# Patient Record
Sex: Female | Born: 1950 | Hispanic: Yes | Marital: Single
Health system: Northeastern US, Community
[De-identification: ages and names within clinical notes are randomized; demographics above are authoritative.]

---

## 1999-11-17 ENCOUNTER — Ambulatory Visit (HOSPITAL_BASED_OUTPATIENT_CLINIC_OR_DEPARTMENT_OTHER): Payer: Self-pay

## 1999-11-17 LAB — CHG CYTP SLIDES CERV/VAG MNL SCRN PHYSICIAN SUPV

## 1999-11-17 LAB — BLOOD COUNT COMPLETE AUTOMATED
HEMATOCRIT: 37.9 % (ref 37.0–47.0)
HEMOGLOBIN: 13 g/dL (ref 12.0–16.0)
MEAN CORP HGB CONC: 34.2 g/dL (ref 32.0–36.0)
MEAN CORPUSCULAR HGB: 31 pg (ref 27.0–31.0)
MEAN CORPUSCULAR VOL: 90.7 fL (ref 81.0–99.0)
MEAN PLATELET VOLUME: 8.2 fL (ref 8.0–12.0)
PLATELET COUNT: 255 10*3/uL (ref 150–400)
RBC DISTRIBUTION WIDTH: 12.9 % (ref 11.5–14.3)
RED BLOOD CELL COUNT: 4.17 MIL/uL — ABNORMAL LOW (ref 4.20–5.40)
WHITE BLOOD CELL COUNT: 5.2 10*3/uL (ref 4.8–10.8)

## 1999-11-17 LAB — AUTOMATED WBC DIFF
BASOPHIL %: 0.8 % (ref 0–2)
EOSINOPHIL %: 2 % (ref 0–7)
LYMPHOCYTE %: 22.5 % (ref 12–39)
MONOCYTE %: 6.3 % (ref 1–12)
NEUTROPHIL %: 68.4 % (ref 46–79)

## 1999-11-17 LAB — COMPREHENSIVE METABOLIC PANEL
ALANINE AMINOTRANSFERASE: 11 IU/L (ref 3–36)
ALBUMIN: 4.3 g/dl (ref 3.5–5.0)
ALKALINE PHOSPHATASE: 53 IU/L (ref 50–136)
ASPARTATE AMINOTRANSFERASE: 23 U/L (ref 7–29)
BILIRUBIN TOTAL: 0.9 mg/dl (ref 0.15–1.0)
BUN (UREA NITROGEN): 15 mg/dl (ref 10–20)
CALCIUM: 9.4 mg/dl (ref 8.5–10.5)
CARBON DIOXIDE: 30 mEQ/L (ref 22–32)
CHLORIDE: 104 mEQ/L (ref 98–110)
CREATININE: 0.8 mg/dl (ref 0.8–1.2)
Glucose Random: 97 mg/dl (ref 65–160)
POTASSIUM: 3.8 mEQ/L (ref 3.5–5.0)
SODIUM: 139 mEQ/L (ref 135–145)
TOTAL PROTEIN: 6.8 g/dl (ref 6.2–8.5)

## 1999-11-17 LAB — IADNA CHLAMYDIA TRACHOMATIS DIRECT PROBE TQ: GENPROBE CHLAMYDIA: NEGATIVE

## 1999-11-17 LAB — IADNA NEISSERIA GONORRHOEAE DIRECT PROBE TQ: GENPROBE GC: NEGATIVE

## 1999-11-17 LAB — CHOLESTEROL SERUM/WHOLE BLOOD TOTAL: Cholesterol: 158 mg/dl (ref ?–200)

## 1999-11-17 LAB — CHG LIPOPROTEIN DIR MEAS HIGH DENSITY CHOLESTEROL: HIGH DENSITY LIPOPROTEIN: 71 mg/dl (ref 35–95)

## 1999-11-17 LAB — RPR: RPR: NONREACTIVE

## 1999-11-17 LAB — CHG LIPOPROTEIN DIRECT MEASUREMENT LDL CHOLESTEROL: LOW DENSITY LIPOPROTEIN DIRECT: 64 mg/dl (ref ?–130)

## 1999-11-17 LAB — THYROID SCREEN TSH REFLEX FT4: THYROID SCREEN TSH REFLEX FT4: 0.62 u[IU]/mL (ref 0.34–5.60)

## 2000-02-09 ENCOUNTER — Ambulatory Visit (HOSPITAL_BASED_OUTPATIENT_CLINIC_OR_DEPARTMENT_OTHER): Payer: Self-pay

## 2000-02-09 LAB — URINALYSIS
BACTERIA: NONE SEEN PER HPF
BILIRUBIN, URINE: NEGATIVE
CASTS: NONE SEEN PER LPF
CRYSTALS: NONE SEEN
GLUCOSE, URINE: NEGATIVE MG/DL
KETONE, URINE: NEGATIVE MG/DL
LEUKOCYTE ESTERASE: NEGATIVE
NITRITE, URINE: NEGATIVE
PH URINE: 6 (ref 5.0–8.0)
PROTEIN, URINE: NEGATIVE MG/DL
RENAL EPITHELIAL CELLS: NONE SEEN PER LPF
SPECIFIC GRAVITY URINE: 1.025 (ref 1.003–1.030)
TRANSITIONAL EPITHELIALS: NONE SEEN PER LPF

## 2000-02-09 LAB — URINE CULTURE/COLONY COUNT: URINE CULTURE/COLONY COUNT: NO GROWTH

## 2000-02-16 ENCOUNTER — Other Ambulatory Visit (HOSPITAL_BASED_OUTPATIENT_CLINIC_OR_DEPARTMENT_OTHER): Payer: Self-pay

## 2000-02-18 ENCOUNTER — Other Ambulatory Visit (HOSPITAL_BASED_OUTPATIENT_CLINIC_OR_DEPARTMENT_OTHER): Payer: Self-pay

## 2000-02-18 LAB — OVA AND PARASITES: OVA AND PARASITES: NONE SEEN

## 2000-02-19 LAB — OVA AND PARASITES
OVA AND PARASITES: NONE SEEN
OVA AND PARASITES: NONE SEEN

## 2000-03-01 ENCOUNTER — Ambulatory Visit (HOSPITAL_BASED_OUTPATIENT_CLINIC_OR_DEPARTMENT_OTHER): Payer: Self-pay

## 2000-03-01 LAB — BLOOD COUNT COMPLETE AUTOMATED
HEMATOCRIT: 36.1 % — ABNORMAL LOW (ref 37.0–47.0)
HEMOGLOBIN: 12.3 g/dL (ref 12.0–16.0)
MEAN CORP HGB CONC: 34.2 g/dL (ref 32.0–36.0)
MEAN CORPUSCULAR HGB: 31.3 pg — ABNORMAL HIGH (ref 27.0–31.0)
MEAN CORPUSCULAR VOL: 91.6 fL (ref 81.0–99.0)
MEAN PLATELET VOLUME: 8.6 fL (ref 8.0–12.0)
PLATELET COUNT: 237 10*3/uL (ref 150–400)
RBC DISTRIBUTION WIDTH: 12.1 % (ref 11.5–14.3)
RED BLOOD CELL COUNT: 3.94 MIL/uL — ABNORMAL LOW (ref 4.20–5.40)
WHITE BLOOD CELL COUNT: 4.8 10*3/uL (ref 4.8–10.8)

## 2000-03-01 LAB — TSH (THYROID STIMULATING HORMONE): TSH (THYROID STIM HORMONE): 0.35 u[IU]/mL (ref 0.34–5.60)

## 2000-03-02 ENCOUNTER — Other Ambulatory Visit (HOSPITAL_BASED_OUTPATIENT_CLINIC_OR_DEPARTMENT_OTHER): Payer: Self-pay

## 2001-03-30 ENCOUNTER — Emergency Department (HOSPITAL_BASED_OUTPATIENT_CLINIC_OR_DEPARTMENT_OTHER): Payer: Self-pay | Admitting: Emergency Medicine

## 2001-04-02 LAB — C-SPINE W OBLIQUES  5 VIEWS

## 2002-08-08 ENCOUNTER — Encounter (HOSPITAL_BASED_OUTPATIENT_CLINIC_OR_DEPARTMENT_OTHER): Payer: Self-pay | Admitting: Internal Medicine

## 2003-08-30 ENCOUNTER — Encounter (HOSPITAL_BASED_OUTPATIENT_CLINIC_OR_DEPARTMENT_OTHER): Payer: Self-pay | Admitting: Internal Medicine

## 2006-12-20 ENCOUNTER — Emergency Department (HOSPITAL_BASED_OUTPATIENT_CLINIC_OR_DEPARTMENT_OTHER): Payer: Self-pay

## 2006-12-20 LAB — URINALYSIS
BILIRUBIN, URINE: NEGATIVE
CASTS: NONE SEEN PER LPF
CRYSTALS: NONE SEEN
GLUCOSE, URINE: NEGATIVE MG/DL
KETONE, URINE: NEGATIVE MG/DL
NITRITE, URINE: NEGATIVE
PH URINE: 7 (ref 5.0–8.0)
PROTEIN, URINE: NEGATIVE MG/DL
SPECIFIC GRAVITY URINE: 1.005 (ref 1.003–1.035)

## 2006-12-21 LAB — URINE CULTURE/COLONY COUNT: URINE CULTURE/COLONY COUNT: NO GROWTH

## 2006-12-24 LAB — EMERGENCY ROOM NOTE

## 2010-11-23 ENCOUNTER — Encounter (HOSPITAL_BASED_OUTPATIENT_CLINIC_OR_DEPARTMENT_OTHER): Payer: Self-pay

## 2010-11-23 ENCOUNTER — Emergency Department (HOSPITAL_BASED_OUTPATIENT_CLINIC_OR_DEPARTMENT_OTHER)
Admission: RE | Admit: 2010-11-23 | Disposition: A | Discharge status: Home / Self Care | Payer: Self-pay | Source: Emergency Department | Attending: Emergency Medicine | Admitting: Emergency Medicine

## 2010-11-23 LAB — PROTHROMBIN TIME
INR: 1 — ABNORMAL LOW (ref 2.0–3.5)
PROTHROMBIN TIME: 11.1 SECONDS (ref 9.5–11.5)

## 2010-11-23 LAB — COMPREHENSIVE METABOLIC PANEL
ALANINE AMINOTRANSFERASE: 22 IU/L (ref 7–35)
ALBUMIN: 4.3 g/dl (ref 3.4–4.8)
ALKALINE PHOSPHATASE: 78 IU/L (ref 25–106)
ANION GAP: 9 mmol/L (ref 2–25)
ASPARTATE AMINOTRANSFERASE: 27 IU/L (ref 8–34)
BILIRUBIN TOTAL: 1.3 mg/dl — ABNORMAL HIGH (ref 0.2–1.1)
BUN (UREA NITROGEN): 10 mg/dl (ref 6–20)
CALCIUM: 9.5 mg/dl (ref 8.6–10.3)
CARBON DIOXIDE: 27 mmol/L (ref 22–32)
CHLORIDE: 106 mmol/L (ref 101–111)
CREATININE: 0.6 mg/dl (ref 0.4–1.2)
ESTIMATED GLOMERULAR FILT RATE: >60 mL/min (ref >60)
Glucose Random: 85 mg/dl (ref 74–160)
POTASSIUM: 4.1 mmol/L (ref 3.5–5.1)
SODIUM: 142 mmol/L (ref 135–144)
TOTAL PROTEIN: 6.7 g/dl (ref 5.9–7.5)

## 2010-11-23 LAB — URINALYSIS
BACTERIA: NONE SEEN PER HPF (ref 0–5)
BILIRUBIN, URINE: NEGATIVE
CASTS: NONE SEEN PER LPF
CRYSTALS: NONE SEEN
GLUCOSE, URINE: NEGATIVE MG/DL
KETONE, URINE: NEGATIVE MG/DL
LEUKOCYTE ESTERASE: NEGATIVE
NITRITE, URINE: NEGATIVE
PH URINE: 6.5 (ref 5.0–8.0)
PROTEIN, URINE: NEGATIVE MG/DL
SPECIFIC GRAVITY URINE: 1.01 (ref 1.003–1.035)
WHITE BLOOD CELLS URINE: NONE SEEN PER HPF (ref 0–4)

## 2010-11-23 LAB — CBC, PLATELET & DIFFERENTIAL
BASOPHIL %: 0.8 % (ref 0.0–2.0)
EOSINOPHIL %: 2.2 % (ref 0.0–7.0)
HEMATOCRIT: 38.2 % (ref 36.0–48.0)
HEMOGLOBIN: 13.2 g/dl (ref 12.0–16.0)
LYMPHOCYTE %: 21.8 % (ref 13.0–39.0)
MEAN CORP HGB CONC: 34.5 g/dl (ref 32.0–36.0)
MEAN CORPUSCULAR HGB: 31.9 pg (ref 27.0–33.0)
MEAN CORPUSCULAR VOL: 92.4 fl (ref 80.0–100.0)
MEAN PLATELET VOLUME: 8.8 fl (ref 6.4–10.8)
MONOCYTE %: 5.3 % (ref 1.0–12.0)
NEUTROPHIL %: 69.9 % (ref 46.0–79.0)
PLATELET COUNT: 195 10*3/uL (ref 150–400)
RBC DISTRIBUTION WIDTH: 12.8 % (ref 11.5–14.3)
RED BLOOD CELL COUNT: 4.13 M/uL — ABNORMAL LOW (ref 4.50–5.10)
WHITE BLOOD CELL COUNT: 5.8 10*3/uL (ref 4.0–10.8)

## 2010-11-23 LAB — APTT: APTT: 29 SECONDS (ref 23.7–33.3)

## 2010-11-23 LAB — LIPASE: LIPASE: 21 U/L (ref 10–50)

## 2010-11-23 LAB — CT ABDOMEN & PELVIS W IV CONTRAST

## 2010-11-23 MED ORDER — CIPROFLOXACIN HCL 250 MG PO TABS
500.0000 mg | ORAL_TABLET | Freq: Once | ORAL | Status: AC
  Administered 2010-11-23: 500 mg via ORAL
  Filled 2010-11-23: qty 2

## 2010-11-23 MED ORDER — MORPHINE SULFATE 2 MG/ML IJ SOLN
2.00 mg | Freq: Once | INTRAMUSCULAR | Status: AC
Start: 2010-11-23 — End: 2010-11-23
  Administered 2010-11-23: 2 mg via INTRAVENOUS
  Filled 2010-11-23: qty 1

## 2010-11-23 MED ORDER — OXYCODONE-ACETAMINOPHEN 5-325 MG PO TABS
1.00 | ORAL_TABLET | ORAL | Status: AC | PRN
Start: 2010-11-23 — End: 2010-11-30

## 2010-11-23 MED ORDER — CIPROFLOXACIN HCL 500 MG PO TABS
500.00 mg | ORAL_TABLET | Freq: Two times a day (BID) | ORAL | Status: AC
Start: 2010-11-23 — End: 2010-12-03

## 2010-11-23 MED ORDER — IOPAMIDOL 76 % IV SOLN
100.00 mL | Freq: Once | INTRAVENOUS | Status: AC
Start: 2010-11-23 — End: 2010-11-23
  Administered 2010-11-23: 100 mL via INTRAVENOUS

## 2010-11-23 MED ORDER — STERILE WATER PO LIQD
520.00 mL | ORAL | Status: AC | PRN
Start: 2010-11-23 — End: 2010-11-23
  Administered 2010-11-23 (×2): 520 mL via ORAL

## 2010-11-23 MED ORDER — SODIUM CHLORIDE 0.9 % IV BOLUS
1000.00 mL | Freq: Once | INTRAVENOUS | Status: AC
Start: 2010-11-23 — End: 2010-11-23
  Administered 2010-11-23: 1000 mL via INTRAVENOUS

## 2010-11-23 MED ORDER — METRONIDAZOLE 500 MG PO TABS
500.00 mg | ORAL_TABLET | Freq: Once | ORAL | Status: AC
Start: 2010-11-23 — End: 2010-11-23
  Administered 2010-11-23: 500 mg via ORAL
  Filled 2010-11-23: qty 1

## 2010-11-23 MED ORDER — METRONIDAZOLE 500 MG PO TABS
500.00 mg | ORAL_TABLET | Freq: Three times a day (TID) | ORAL | Status: AC
Start: 2010-11-23 — End: 2010-11-28

## 2010-11-23 NOTE — Discharge Instructions (Signed)
At home, rest, drink plenty of fluids.

## 2010-11-24 LAB — URINE CULTURE/COLONY COUNT: URINE CULTURE/COLONY COUNT: NO GROWTH

## 2010-12-01 LAB — EMERGENCY ROOM NOTE

## 2018-08-23 ENCOUNTER — Encounter (HOSPITAL_BASED_OUTPATIENT_CLINIC_OR_DEPARTMENT_OTHER): Payer: Self-pay | Admitting: Ophthalmology

## 2018-08-23 ENCOUNTER — Ambulatory Visit: Payer: Medicare (Managed Care) | Attending: Ophthalmology | Admitting: Ophthalmology

## 2018-08-23 DIAGNOSIS — H268 Other specified cataract: Secondary | ICD-10-CM | POA: Diagnosis present

## 2018-08-23 DIAGNOSIS — H43392 Other vitreous opacities, left eye: Principal | ICD-10-CM | POA: Insufficient documentation

## 2018-08-23 DIAGNOSIS — Z83511 Family history of glaucoma: Secondary | ICD-10-CM | POA: Insufficient documentation

## 2018-08-23 DIAGNOSIS — H5200 Hypermetropia, unspecified eye: Secondary | ICD-10-CM | POA: Insufficient documentation

## 2018-08-23 DIAGNOSIS — H04129 Dry eye syndrome of unspecified lacrimal gland: Secondary | ICD-10-CM | POA: Insufficient documentation

## 2018-08-23 DIAGNOSIS — H524 Presbyopia: Secondary | ICD-10-CM | POA: Diagnosis not present

## 2018-08-23 MED ORDER — CARBOXYMETHYLCELLULOSE SODIUM 0.5 % OP SOLN: 1 [drp] | mL | Freq: Four times a day (QID) | OPHTHALMIC | 11 refills | 0 days | Status: AC | PRN

## 2018-08-23 MED ORDER — CARBOXYMETHYLCELLULOSE SODIUM 0.5 % OP SOLN
1.0000 [drp] | Freq: Four times a day (QID) | OPHTHALMIC | 11 refills | Status: AC | PRN
Start: 2018-08-23 — End: 2019-08-24

## 2018-08-23 NOTE — Progress Notes (Signed)
New patient here for Eye Examination.   Vision is ok OU with glasses.   No pain.   Seeing floaters(mosquito like)  over the vision OS for about year.   No flashes.   Denies ocular/head trauma.   Older sister had surgery done for Glaucoma.    Ocular meds   Systane 1/day OU

## 2018-08-27 NOTE — Progress Notes (Signed)
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

## 2024-05-04 IMAGING — MR punho Direito
12 series · 40 of 40 positions shown · non-contrast
Comparison: none

[Series 4: coronal_dp_fs · coronal · right · 2.5mm · 0.55mm/px · 1 of 19 slices shown]
[im 1/19]
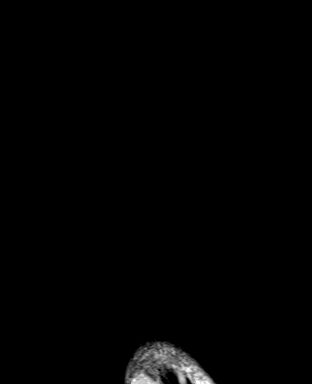

[Series 5: coronal_t1 · coronal · right · 2.5mm · 0.55mm/px · 2 of 19 slices shown]
[im 1/19]
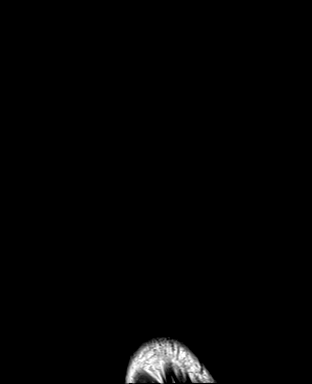
[im 19/19]
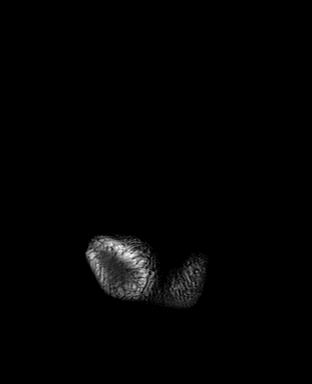

[Series 6: axial_dp_fs_mao · axial · right · 4.0mm · 0.29mm/px · z∈[-80,+105]mm · 4 of 38 slices shown]
[im 1/38]
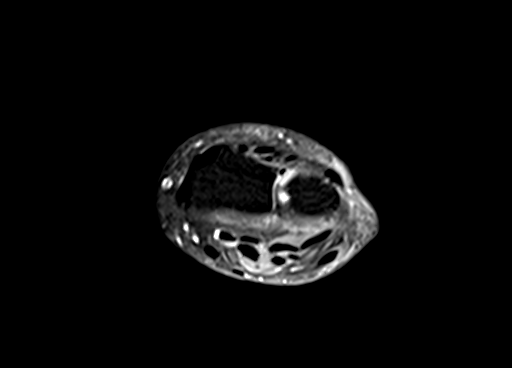
[im 13/38]
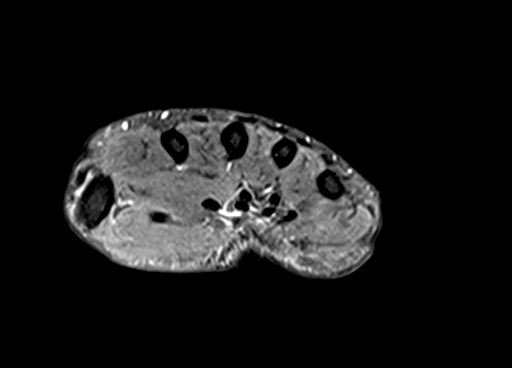
[im 25/38]
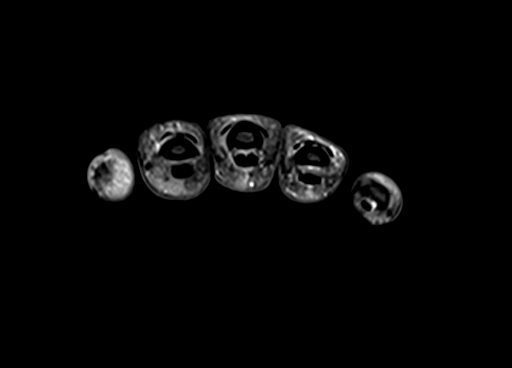
[im 38/38]
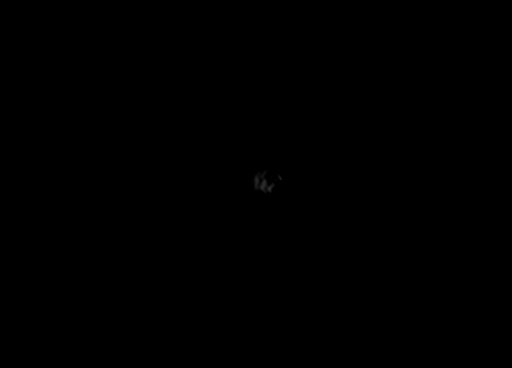

[Series 7: axial_t1_mao · axial · right · 4.0mm · 0.47mm/px · z∈[-80,+105]mm · 4 of 38 slices shown]
[im 1/38]
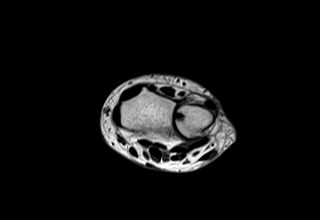
[im 13/38]
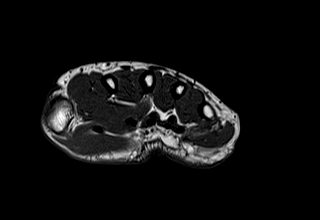
[im 25/38]
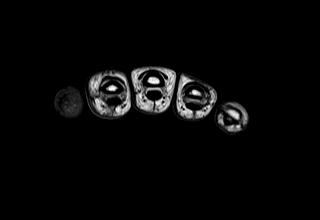
[im 38/38]
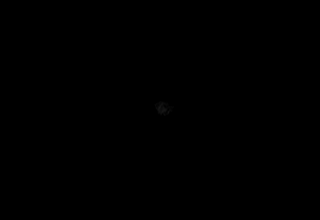

[Series 8: sagital_dp_fs · sagittal · right · 4.0mm · 0.66mm/px · 2 of 20 slices shown]
[im 1/20]
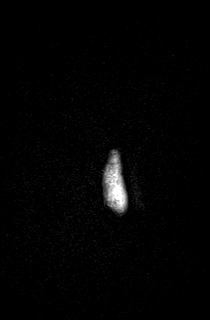
[im 20/20]
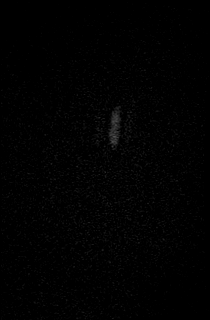

[Series 9: cor dp fat · coronal · right · 3.0mm · 0.38mm/px · 1 of 14 slices shown]
[im 1/14]
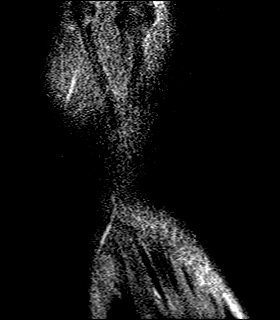

[Series 13: cor t1_vibe_dixon_iso_opp · coronal · right · 1.1mm · 1.15mm/px · 5 of 56 slices shown]
[im 1/56]
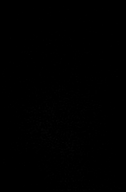
[im 14/56]
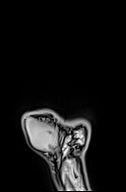
[im 28/56]
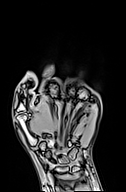
[im 42/56]
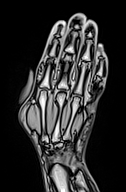
[im 56/56]
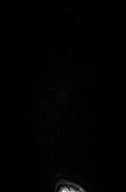

[Series 14: cor t1_vibe_dixon_iso_in · coronal · right · 1.1mm · 1.15mm/px · 5 of 55 slices shown]
[im 1/55]
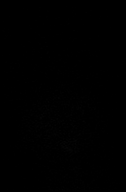
[im 14/55]
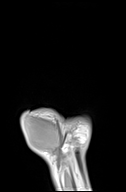
[im 28/55]
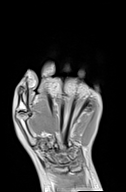
[im 41/55]
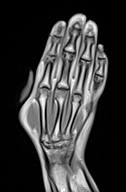
[im 55/55]
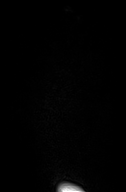

[Series 15: cor t1_vibe_dixon_iso_f · coronal · right · 1.1mm · 1.15mm/px · 5 of 56 slices shown]
[im 1/56]
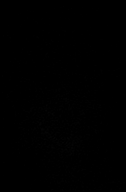
[im 14/56]
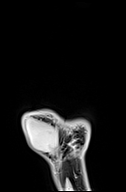
[im 28/56]
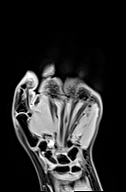
[im 42/56]
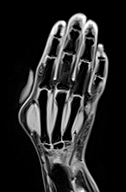
[im 56/56]
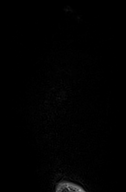

[Series 16: cor t1_vibe_dixon_iso_w · coronal · right · 1.1mm · 1.15mm/px · 5 of 53 slices shown]
[im 1/53]
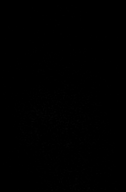
[im 14/53]
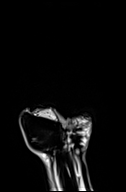
[im 27/53]
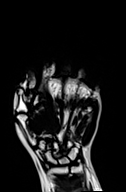
[im 40/53]
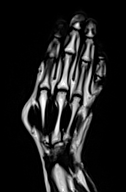
[im 53/53]
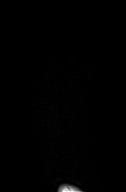

[Series 17: coronal_t1_fs · coronal · right · 2.5mm · 0.66mm/px · 2 of 16 slices shown]
[im 1/16]
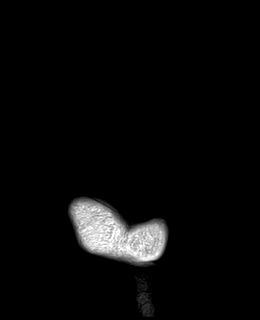
[im 16/16]
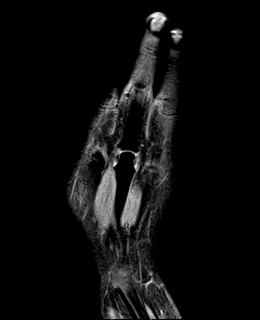

[Series 18: axial_t1_fs_mao · axial · right · 4.0mm · 0.29mm/px · z∈[-32,+153]mm · 4 of 38 slices shown]
[im 1/38]
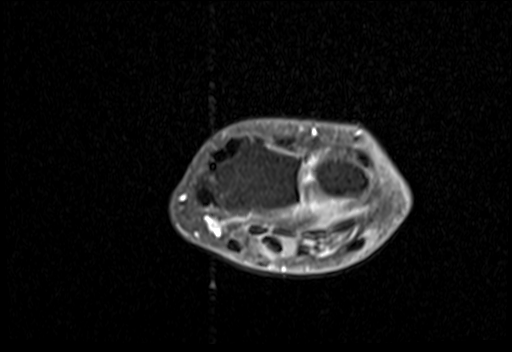
[im 13/38]
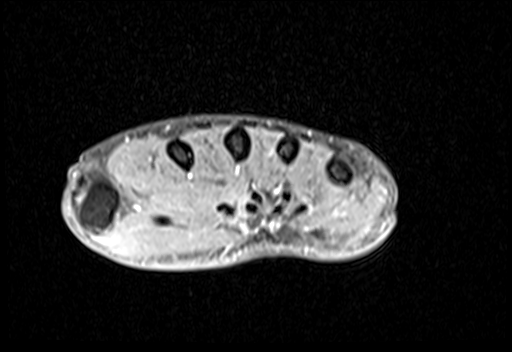
[im 25/38]
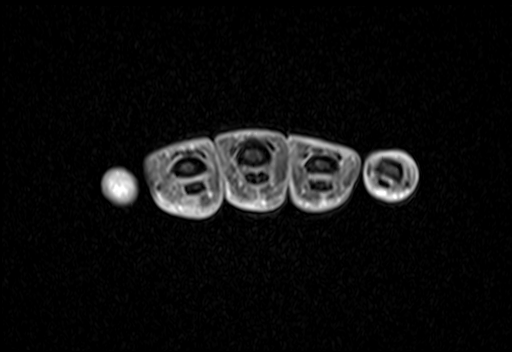
[im 38/38]
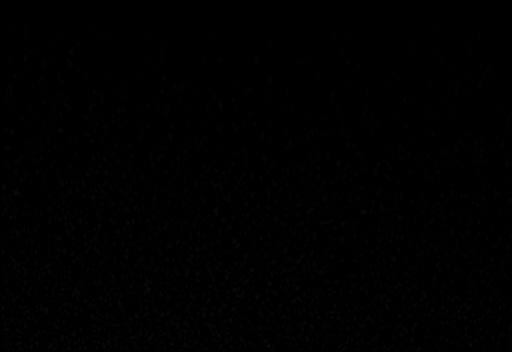

[40 of 40 positions shown; findings below may reference images not displayed]

RESSONÂNCIA MAGNÉTICA DO PUNHO DIREITO

TÉCNICA:
Exame realizado em equipamento de ressonância magnética com sequências, ponderações e planos específicos para o segmento de interesse, antes e após a administração endovenosa do meio de contraste.

RESULTADO:
Sinais de rizartrose caracterizada por afilamento condral da base do primeiro metacarpo e trapézio distal com edema ósseo subcortical e osteófitos marginais.
Alteração fibrocística no capitato.
Discreta sinovite radioulnar distal, radiocárpica, intercárpica e carpometacarpal notando-se a presença de pequenas erosões ósseas no rádio e ulna distais e nos ossos do carpo. A possibilidade de artropatia inflamatória, entre elas de artrite reumatoide, deve ser considerada neste contexto.
Demais estruturas ósseas com morfologia e sinal medular conservados.
Ligamentos escafosemilunar e lunopiramidal íntegros.
Complexo da fibrocartilagem triangular de forma e sinal conservados, sem lesões.
Tendões flexores preservados no interior do túnel do carpo.
Tendões dos seis compartimentos extensores íntegros.
Retináculo dos flexores de topografia habitual, contornos regulares e espessura conservada.
Nervo mediano de forma, espessura e sinal preservados no interior do túnel do carpo.
Estruturas do canal de [place] sem alterações evidentes.
Planos musculares anatômicos, sem evidências de lesões.

CONCLUSÃO:
Sinais de rizartrose.
Discreta sinovite radioulnar distal, radiocárpica, intercárpica e carpometacarpal notando-se a presença de pequenas erosões ósseas no rádio e ulna distais e nos ossos do carpo. A possibilidade de artropatia inflamatória, entre elas de artrite reumatoide, deve ser considerada neste contexto.

## 2024-05-04 IMAGING — MR Mao Direita
12 series · 40 of 40 positions shown · non-contrast
Comparison: none

[Series 4: coronal_dp_fs · coronal · right · 2.5mm · 0.55mm/px · 1 of 19 slices shown]
[im 1/19]
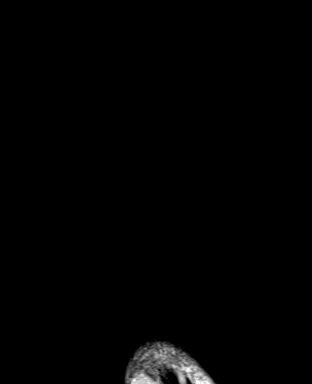

[Series 5: coronal_t1 · coronal · right · 2.5mm · 0.55mm/px · 2 of 19 slices shown]
[im 1/19]
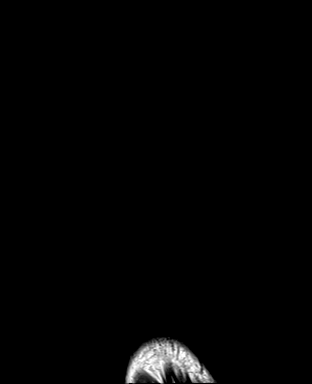
[im 19/19]
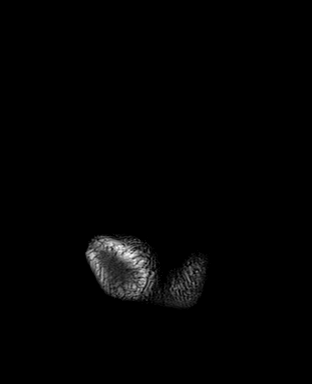

[Series 6: axial_dp_fs_mao · axial · right · 4.0mm · 0.29mm/px · z∈[-80,+105]mm · 4 of 38 slices shown]
[im 1/38]
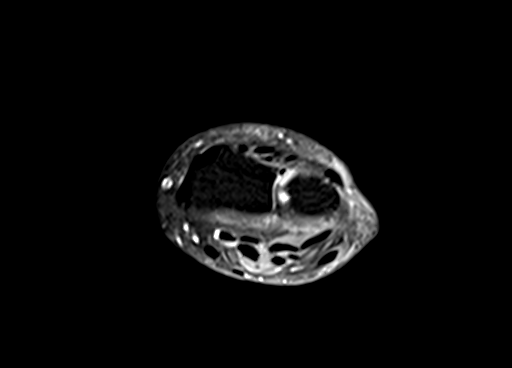
[im 13/38]
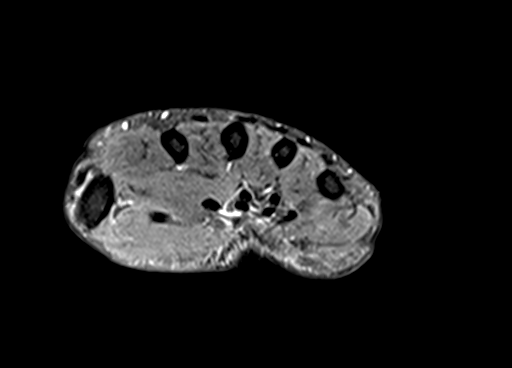
[im 25/38]
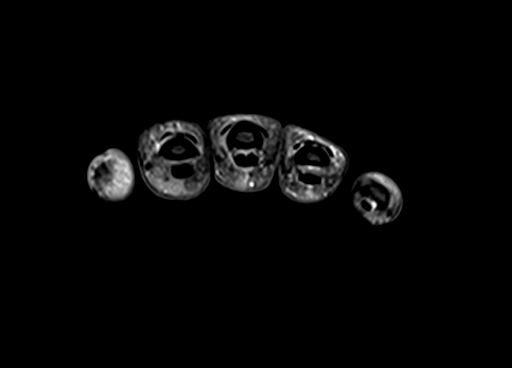
[im 38/38]
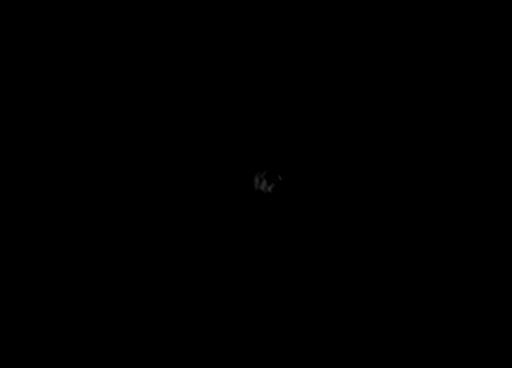

[Series 7: axial_t1_mao · axial · right · 4.0mm · 0.47mm/px · z∈[-80,+105]mm · 4 of 38 slices shown]
[im 1/38]
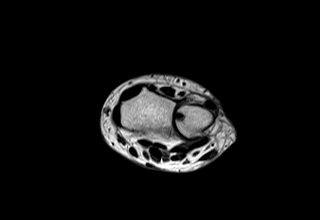
[im 13/38]
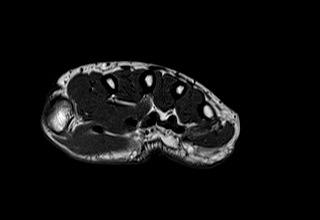
[im 25/38]
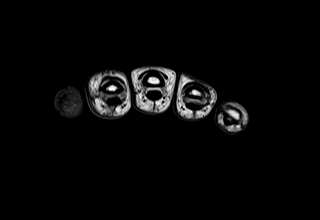
[im 38/38]
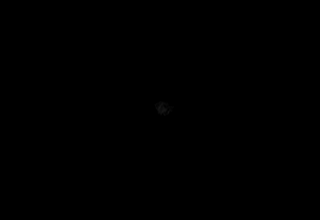

[Series 8: sagital_dp_fs · sagittal · right · 4.0mm · 0.66mm/px · 2 of 20 slices shown]
[im 1/20]
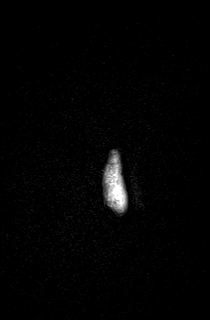
[im 20/20]
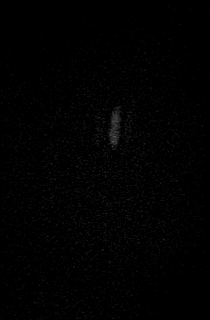

[Series 9: cor dp fat · coronal · right · 3.0mm · 0.38mm/px · 1 of 14 slices shown]
[im 1/14]
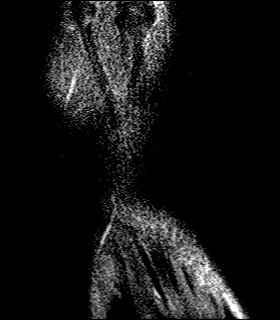

[Series 13: cor t1_vibe_dixon_iso_opp · coronal · right · 1.1mm · 1.15mm/px · 5 of 56 slices shown]
[im 1/56]
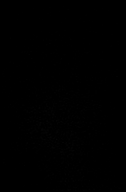
[im 14/56]
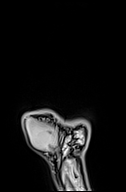
[im 28/56]
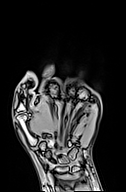
[im 42/56]
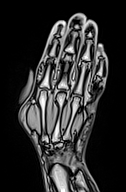
[im 56/56]
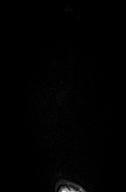

[Series 14: cor t1_vibe_dixon_iso_in · coronal · right · 1.1mm · 1.15mm/px · 5 of 55 slices shown]
[im 1/55]
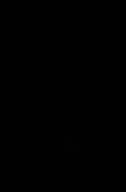
[im 14/55]
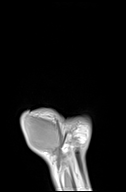
[im 28/55]
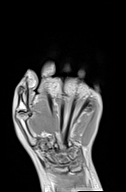
[im 41/55]
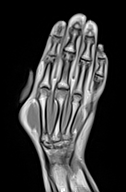
[im 55/55]
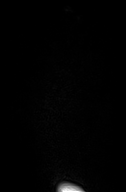

[Series 15: cor t1_vibe_dixon_iso_f · coronal · right · 1.1mm · 1.15mm/px · 5 of 56 slices shown]
[im 1/56]
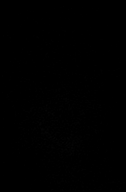
[im 14/56]
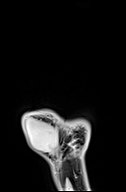
[im 28/56]
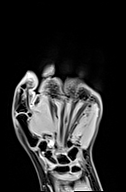
[im 42/56]
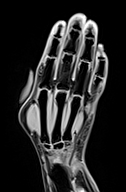
[im 56/56]
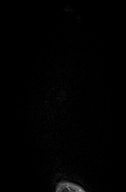

[Series 16: cor t1_vibe_dixon_iso_w · coronal · right · 1.1mm · 1.15mm/px · 5 of 53 slices shown]
[im 1/53]
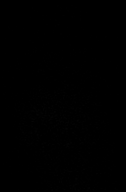
[im 14/53]
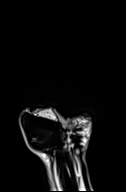
[im 27/53]
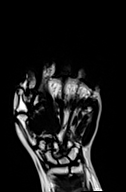
[im 40/53]
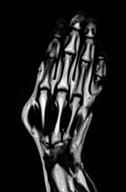
[im 53/53]
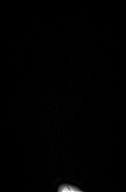

[Series 17: coronal_t1_fs · coronal · right · 2.5mm · 0.66mm/px · 2 of 16 slices shown]
[im 1/16]
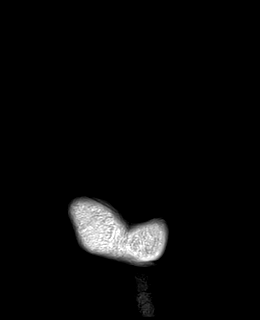
[im 16/16]
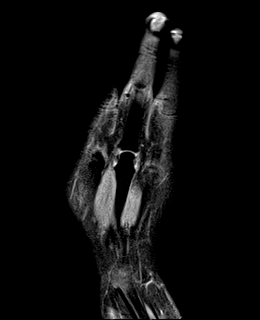

[Series 18: axial_t1_fs_mao · axial · right · 4.0mm · 0.29mm/px · z∈[-32,+153]mm · 4 of 38 slices shown]
[im 1/38]
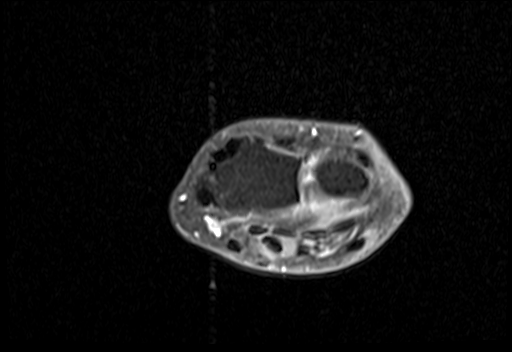
[im 13/38]
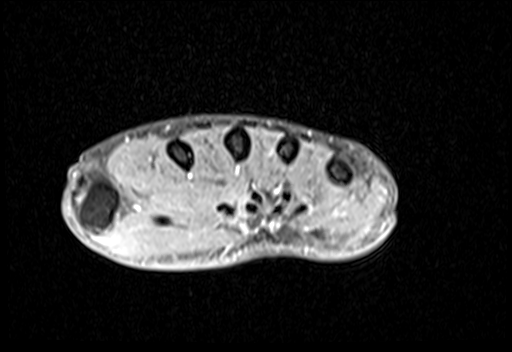
[im 25/38]
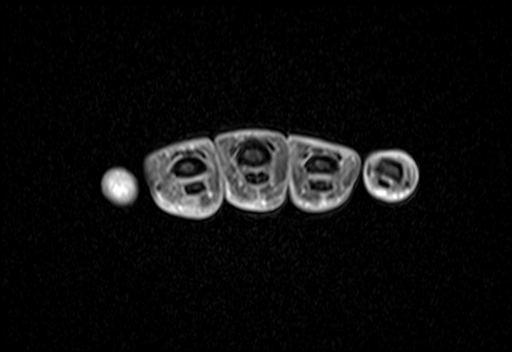
[im 38/38]
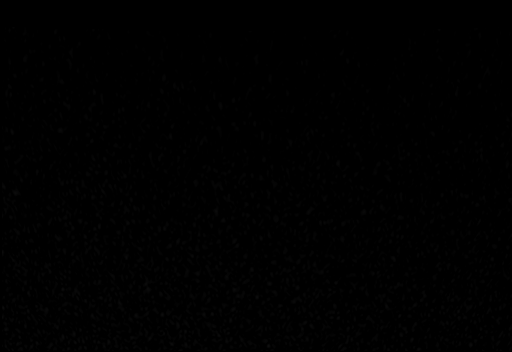

[40 of 40 positions shown; findings below may reference images not displayed]

RESSONÂNCIA MAGNÉTICA DA MÃO DIREITA

TÉCNICA:
Exame realizado em equipamento de ressonância magnética com sequências, ponderações e planos específicos para o segmento de interesse, antes e após a administração endovenosa do meio de contraste.

RESULTADO:
Discretas alterações degenerativas das articulações interfalangeanas proximais e distais do segundo ao quinto raios notando-se afilamento condral e osteófitos marginais.
Discreta tenossinovite dos flexores do segundo ao quinto dedos.
Demais estruturas ósseas com corticais íntegras e espaços medulares sem anormalidades de sinal.
Ausência de derrame articular ou espessamento sinovial significativos.
Demais tendões extensores e flexores com forma, contornos, espessura e intensidade de sinal normais.
Regiões das polias flexoras sem anormalidades evidentes.
Ligamentos colaterais íntegros.
Planos musculares regionais com morfologia, trofismo e intensidade de sinal habituais.

CONCLUSÃO:
Discretas alterações degenerativas das articulações interfalangeanas proximais e distais do segundo ao quinto raios.
Discreta tenossinovite dos flexores do segundo ao quinto dedos.

## 2024-05-04 IMAGING — MR Mao Esquerda
14 series · 40 of 40 positions shown · IV contrast (CONTRASTE)
Comparison: none

[Series 4: coronal_dp_fs · coronal · left · 2.5mm · 0.55mm/px · 1 of 19 slices shown]
[im 1/19]
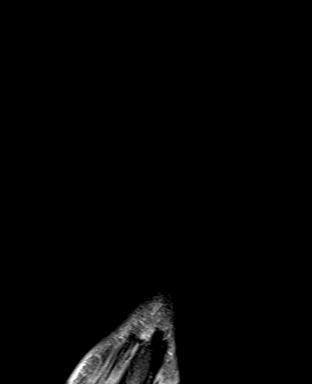

[Series 5: coronal_t1 · coronal · left · 2.5mm · 0.55mm/px · 1 of 19 slices shown]
[im 1/19]
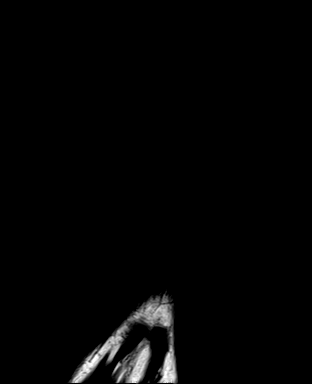

[Series 6: axial_dp_fs_mao · axial · left · 4.0mm · 0.29mm/px · z∈[-93,+102]mm · 3 of 40 slices shown]
[im 1/40]
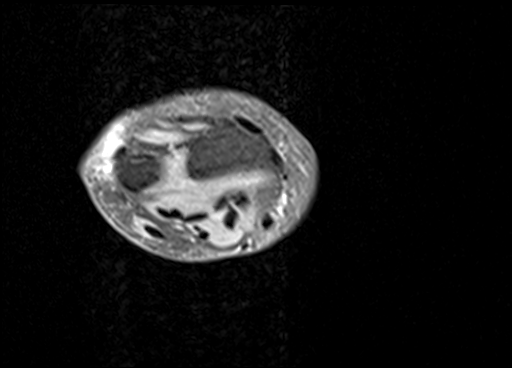
[im 20/40]
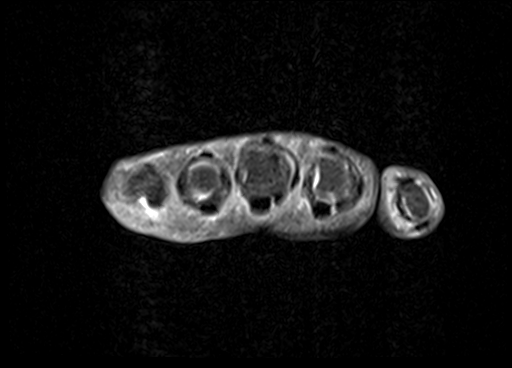
[im 40/40]
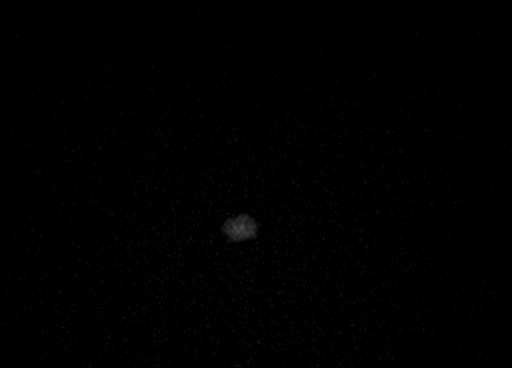

[Series 7: axial_t1_mao · axial · left · 4.0mm · 0.47mm/px · z∈[-93,+102]mm · 3 of 40 slices shown (1 of 2)]
[im 1/40]
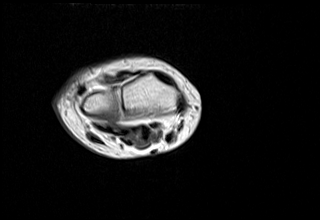
[im 20/40]
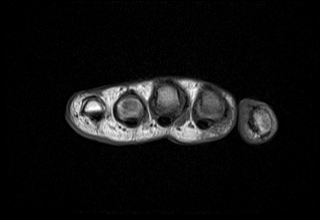
[im 40/40]
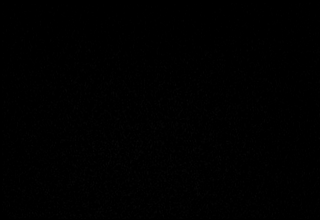

[Series 8: sagital_dp_fs · sagittal · left · 4.0mm · 0.66mm/px · 2 of 20 slices shown (1 of 2)]
[im 1/20]
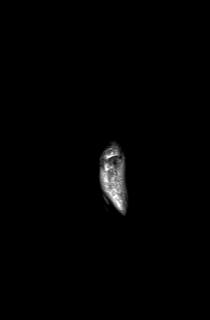
[im 20/20]
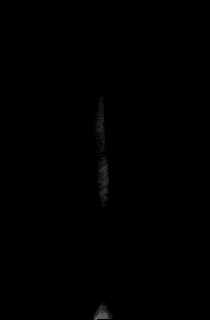

[Series 9: cor dp fat · coronal · left · 2.5mm · 0.41mm/px · 1 of 16 slices shown]
[im 1/16]
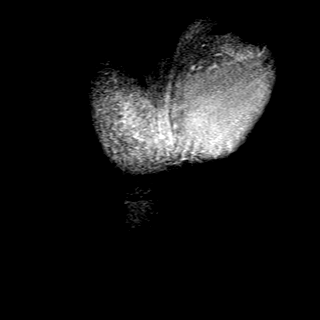

[Series 10: axial_t1_mao · axial · left · 4.0mm · 0.47mm/px · z∈[-93,+102]mm · 3 of 40 slices shown (2 of 2)]
[im 1/40]
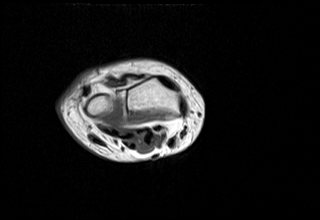
[im 20/40]
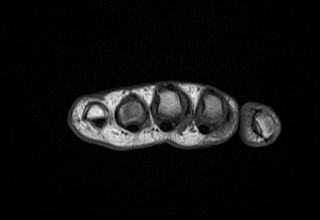
[im 40/40]
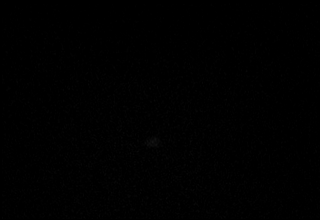

[Series 11: sagital_dp_fs · sagittal · left · 4.0mm · 0.66mm/px · 2 of 20 slices shown (2 of 2)]
[im 1/20]
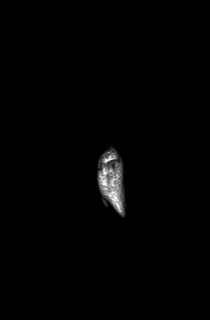
[im 20/20]
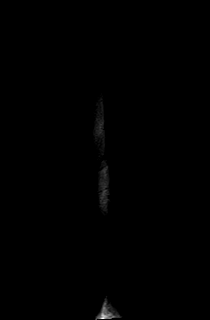

[Series 12: cor t1_vibe_dixon_iso_opp · coronal · left · 1.1mm · 1.15mm/px · 5 of 56 slices shown]
[im 1/56]
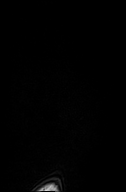
[im 14/56]
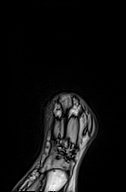
[im 28/56]
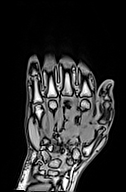
[im 42/56]
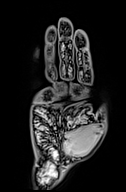
[im 56/56]
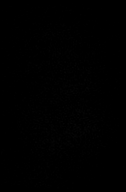

[Series 13: cor t1_vibe_dixon_iso_in · coronal · left · 1.1mm · 1.15mm/px · 5 of 56 slices shown]
[im 1/56]
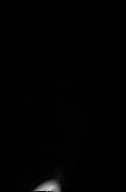
[im 14/56]
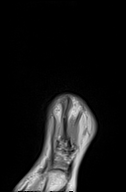
[im 28/56]
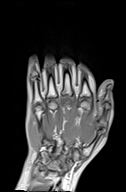
[im 42/56]
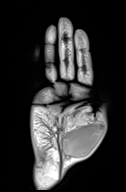
[im 56/56]
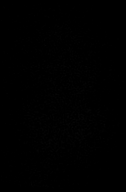

[Series 14: cor t1_vibe_dixon_iso_f · coronal · left · 1.1mm · 1.15mm/px · 5 of 56 slices shown]
[im 1/56]
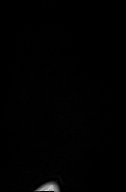
[im 14/56]
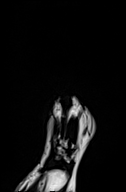
[im 28/56]
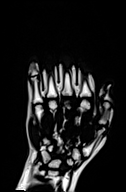
[im 42/56]
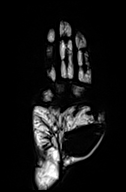
[im 56/56]
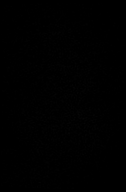

[Series 15: cor t1_vibe_dixon_iso_w · coronal · left · 1.1mm · 1.15mm/px · 5 of 56 slices shown]
[im 1/56]
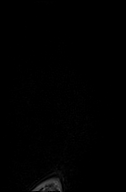
[im 14/56]
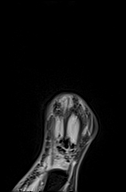
[im 28/56]
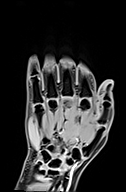
[im 42/56]
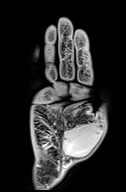
[im 56/56]
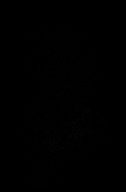

[Series 16: coronal_t1_fs · coronal · left · 2.5mm · 0.66mm/px · 1 of 16 slices shown]
[im 1/16]
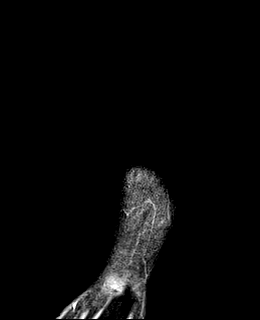

[Series 17: axial_t1_fs_mao · axial · left · 4.0mm · 0.29mm/px · z∈[-95,+100]mm · 3 of 40 slices shown]
[im 1/40]
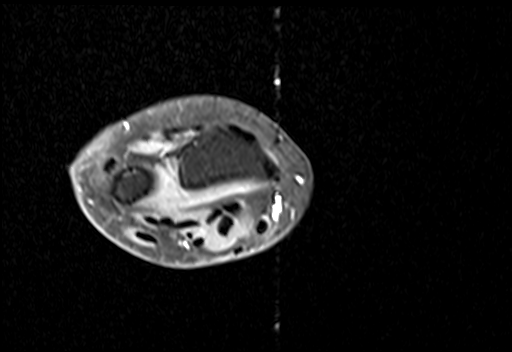
[im 20/40]
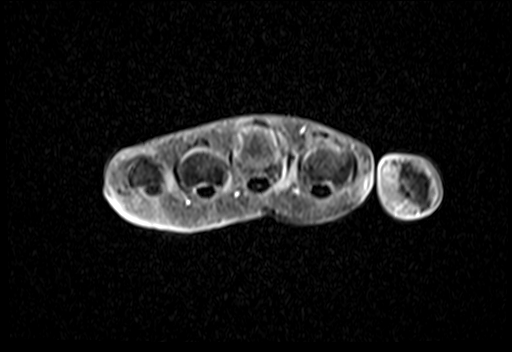
[im 40/40]
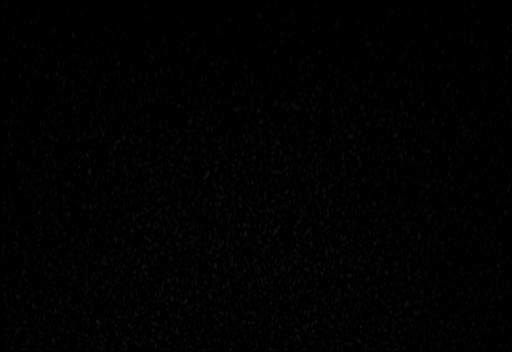

[40 of 40 positions shown; findings below may reference images not displayed]

RESSONÂNCIA MAGNÉTICA DA MÃO ESQUERDA

TÉCNICA:
Exame realizado em equipamento de ressonância magnética com sequências, ponderações e planos específicos para o segmento de interesse, antes e após a administração endovenosa do meio de contraste.

RESULTADO:
Alterações de aspecto degenerativo entre a base do primeiro metacarpo e o trapézio caracterizadas por osteofitose marginal, afilamento irregular do revestimento condral com fissuras condrais profundas associadas a tênues focos de edema ósseo na medular subcondral das facetas ósseas apostas além de moderado espessamento capsuloligamentar com edema pericapsular adjacente.
Leves alterações degenerativas nas interfalangeanas distais e proximais do segundo, terceiro, quarto e quinto dedos com esboços osteofitários marginais afilamento irregular do revestimento condral e raras alterações fibrocísticas subcondrais um pouco mais evidente na face radial da falange intermédia do segundo e terceiro dedos.
Diminuta imagem cística subcortical na cabeça do quinto metacarpo.
Discreto espessamento cápsulo ligamentar com aparente espessamento sinovial e edema pericapsular ao nível da metacarpofalangeana do terceiro dedo exibindo tênue realce pelo agente paramagnético de contraste.
Há também alterações degenerativas nas articulações entre as bases dos metacarpos e os respectivos ossos do carpo no segundo terceiro e quarto raios caracterizadas por afilamento irregular do revestimento condral e alterações fibrocísticas margeadas por edema exibindo realce pelo agente paramagnético de contraste.
Mínima distensão líquida da bainha tendínea dos flexores do primeiro, terceiro, quarto e quinto dedos.
Discreta alteração de aspecto fibrocicatricial na margem radial do terço distal do dedo indicador, sequela de trauma prévio?
Tendões extensores com forma, contornos, espessura e intensidade de sinal normais.
Ausência de lesões expansivas em partes moles periarticulares.
Planos musculares regionais com morfologia, trofismo e intensidade de sinal habituais.

CONCLUSÃO:
Moderada rizartrose com sinais de pericapsulite.
Alterações degenerativas osteoarticulares das interfalangeanas distais e proximais do segundo terceiro quarto e quinto dedos.
Leves alterações degenerativas nas articulações entre as bases do metacarpos e ossos do carpo do terceiro e quarto raios.
Tenossinovite dos flexores primeiro, terceiro, quarto e quinto dedos.
Sinovite pericapsular junto ao metacarpofalangeana do terceiro dedo.

## 2024-05-04 IMAGING — MR PUNHO ESQUERDO
14 series · 40 of 40 positions shown · non-contrast
Comparison: none

[Series 4: coronal_dp_fs · coronal · left · 2.5mm · 0.55mm/px · 1 of 19 slices shown]
[im 1/19]
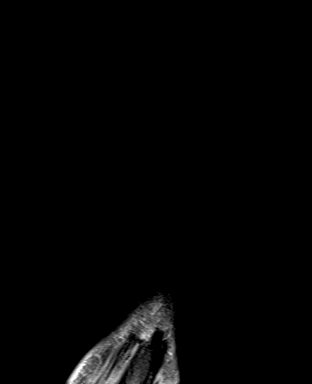

[Series 5: coronal_t1 · coronal · left · 2.5mm · 0.55mm/px · 1 of 19 slices shown]
[im 1/19]
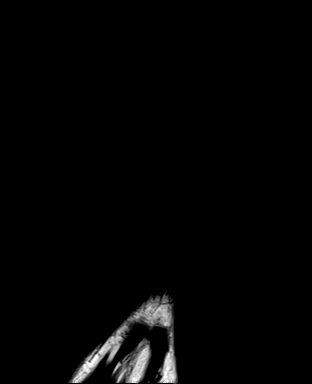

[Series 6: axial_dp_fs_mao · axial · left · 4.0mm · 0.29mm/px · z∈[-93,+102]mm · 3 of 40 slices shown]
[im 1/40]
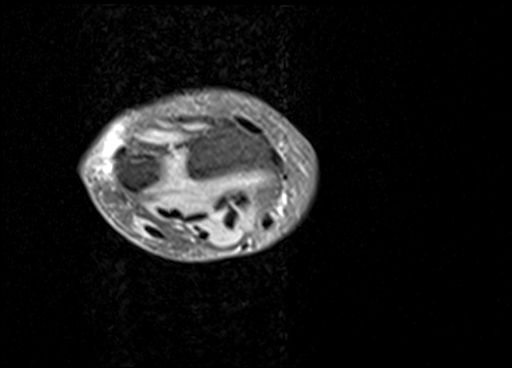
[im 20/40]
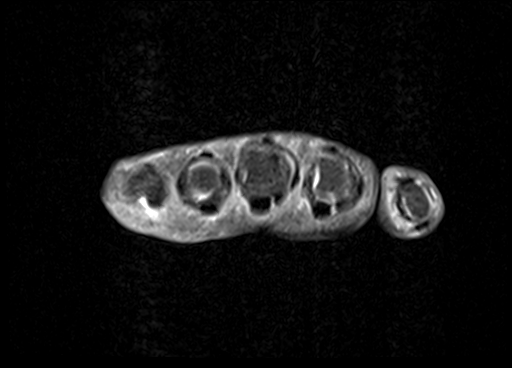
[im 40/40]
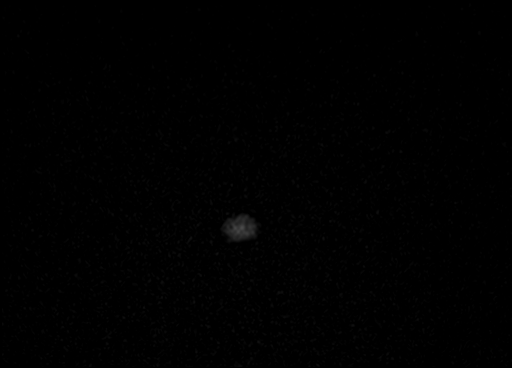

[Series 7: axial_t1_mao · axial · left · 4.0mm · 0.47mm/px · z∈[-93,+102]mm · 3 of 40 slices shown (1 of 2)]
[im 1/40]
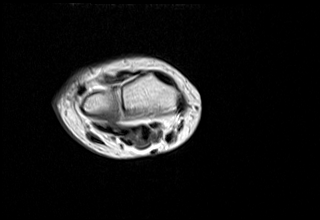
[im 20/40]
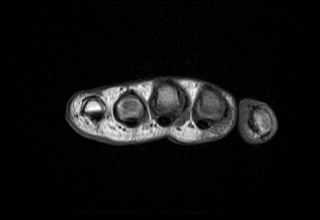
[im 40/40]
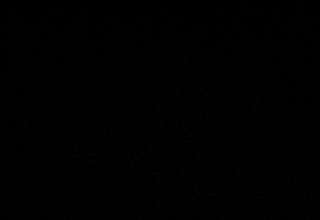

[Series 8: sagital_dp_fs · sagittal · left · 4.0mm · 0.66mm/px · 2 of 20 slices shown (1 of 2)]
[im 1/20]
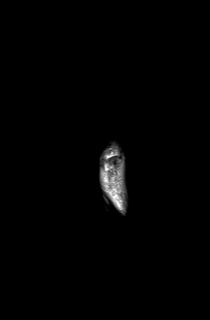
[im 20/20]
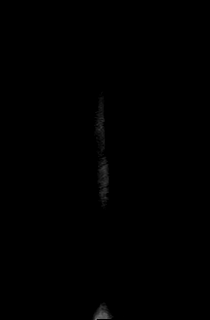

[Series 9: cor dp fat · coronal · left · 2.5mm · 0.41mm/px · 1 of 16 slices shown]
[im 1/16]
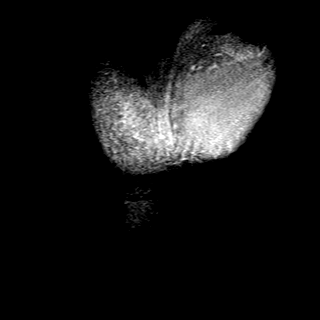

[Series 10: axial_t1_mao · axial · left · 4.0mm · 0.47mm/px · z∈[-93,+102]mm · 3 of 40 slices shown (2 of 2)]
[im 1/40]
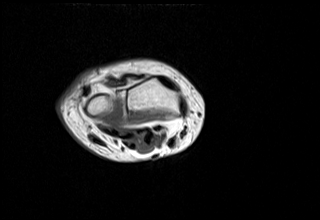
[im 20/40]
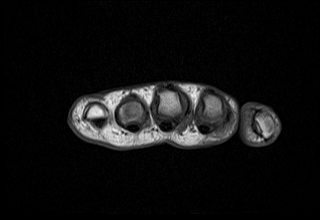
[im 40/40]
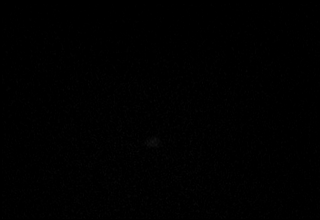

[Series 11: sagital_dp_fs · sagittal · left · 4.0mm · 0.66mm/px · 2 of 20 slices shown (2 of 2)]
[im 1/20]
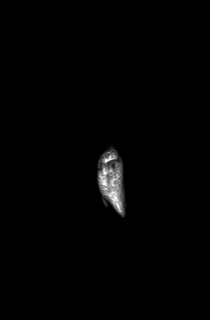
[im 20/20]
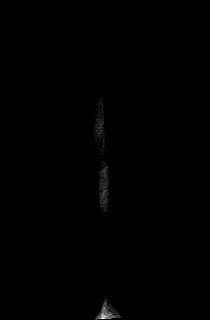

[Series 12: cor t1_vibe_dixon_iso_opp · coronal · left · 1.1mm · 1.15mm/px · 5 of 56 slices shown]
[im 1/56]
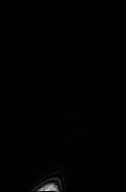
[im 14/56]
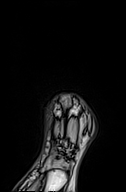
[im 28/56]
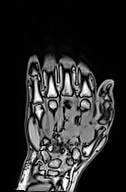
[im 42/56]
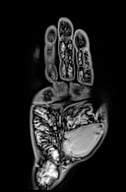
[im 56/56]
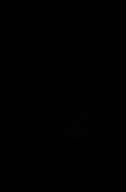

[Series 13: cor t1_vibe_dixon_iso_in · coronal · left · 1.1mm · 1.15mm/px · 5 of 56 slices shown]
[im 1/56]
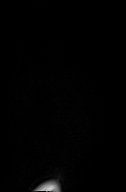
[im 14/56]
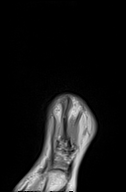
[im 28/56]
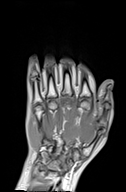
[im 42/56]
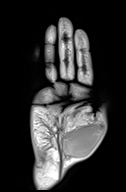
[im 56/56]
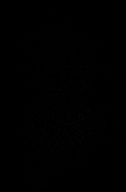

[Series 14: cor t1_vibe_dixon_iso_f · coronal · left · 1.1mm · 1.15mm/px · 5 of 56 slices shown]
[im 1/56]
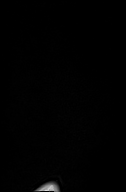
[im 14/56]
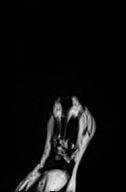
[im 28/56]
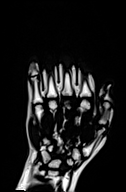
[im 42/56]
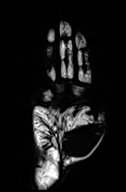
[im 56/56]
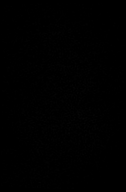

[Series 15: cor t1_vibe_dixon_iso_w · coronal · left · 1.1mm · 1.15mm/px · 5 of 56 slices shown]
[im 1/56]
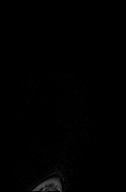
[im 14/56]
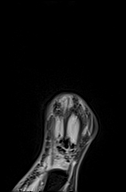
[im 28/56]
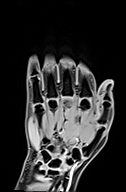
[im 42/56]
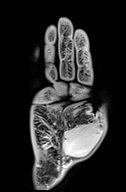
[im 56/56]
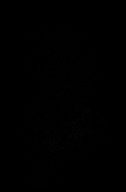

[Series 16: coronal_t1_fs · coronal · left · 2.5mm · 0.66mm/px · 1 of 16 slices shown]
[im 1/16]
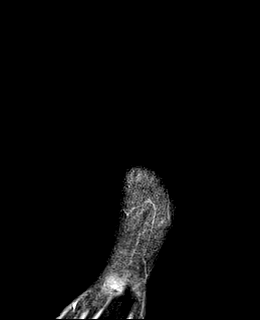

[Series 17: axial_t1_fs_mao · axial · left · 4.0mm · 0.29mm/px · z∈[-95,+100]mm · 3 of 40 slices shown]
[im 1/40]
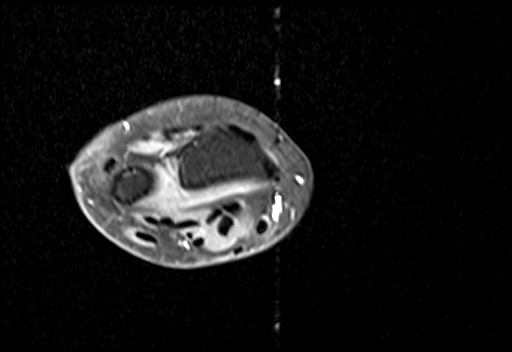
[im 20/40]
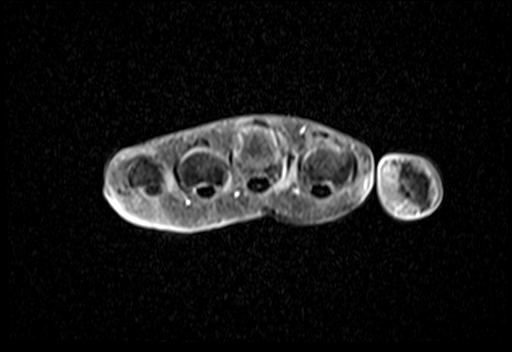
[im 40/40]
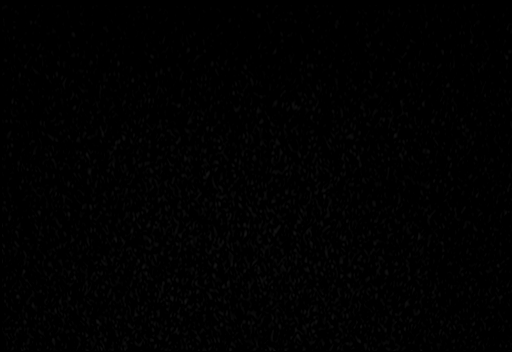

[40 of 40 positions shown; findings below may reference images not displayed]

RESSONÂNCIA MAGNÉTICA DO PUNHO ESQUERDO

TÉCNICA:
Exame realizado em equipamento de ressonância magnética com sequências, ponderações e planos específicos para o segmento de interesse, antes e após a administração endovenosa do meio de contraste.

RESULTADO:
Alterações de aspecto degenerativo entre a base do primeiro metacarpo e o trapézio caracterizadas por osteofitose marginal, afilamento irregular do revestimento condral com fissuras condrais profundas associadas a tênues focos de edema ósseo na medular subcondral das facetas ósseas apostas além de moderado espessamento capsuloligamentar com edema pericapsular adjacente.
Imagens císticas / erosões corticais com herniações sinoviais nos ossos do carpo, base de alguns metacarpos e região epifisária do rádio da ulna margeadas por edema exibindo leve realce pelo agente paramagnético de contraste associado a espessamento irregular sinovial das fileiras do carpo exibindo realce pelo agente paramagnético de contraste achados que podem estar relacionados a componente de sinovite.
Variação ulnar negativa.
Alterações de aspecto degenerativo nas articulações radiocarpal e intercarpais caracterizadas por osteofitose marginal, afilamento irregular do revestimento condral com fissuras condrais profundas determinando edema ósseo subcondral nas facetas ósseas apostas um pouco mais evidente na epífise radial.
Alteração de sinal do menisco homólogo componente do complexo da fibrocartilagem triangular associado a lesão intrassubstancial do disco articular.
Distensão líquida do recesso articular radioulnar distal.
Distensão líquida da bainha tendínea do flexor radial do carpo com realce pelo agente paramagnético de contraste.
Edema e borramento das partes moles adjacentes aos componentes membranoso e palmar do ligamento escafossemilunar, podendo estar relacionado a alteração secundária a degeneração / sinovite.
Discreto desvio dorsal do semilunar em relação ao eixo do escafoide podendo estar relacionado a instabilidade segmentar.
Tendões dos seis compartimentos extensores íntegros.
Retináculo dos flexores de topografia habitual, contornos regulares e espessura conservada.
Nervo mediano de forma, espessura e sinal preservados no interior do túnel do carpo.
Estruturas do canal de [place] sem alterações evidentes.
Planos musculares anatômicos, sem evidências de lesões.

CONCLUSÃO:
Moderada rizartrose com sinais de pericapsulite.
Variação ulnar negativa.
Alterações morfoestruturais nos ossos do carpo e articulações intercarpais e radiocarpais caracterizadas por alterações fibrocísticas / erosões ósseas com moderada sinovite pericapsular achados que podem estar relacionados a componente inflamatório de artrite (não especificada).Correlacionar com dados da história clínica, dados laboratoriais e exames anteriores não disponíveis para comparação.
Alterações degenerativas nos componentes do complexo da fibrocartilagem triangular.
Tenossinovite do flexor radial do carpo.
Edema e borramento das partes moles adjacentes aos componentes membranoso e palmar do ligamento escafossemilunar, podendo estar relacionado a alteração secundária a degeneração / sinovite.
Discreto desvio dorsal do semilunar em relação ao eixo do escafoide podendo estar relacionado a instabilidade segmentar.
